# Patient Record
Sex: Female | Born: 1995 | Race: Black or African American | Hispanic: No | Marital: Single | Smoking: Never smoker
Health system: Southern US, Community
[De-identification: ages and names within clinical notes are randomized; demographics above are authoritative.]

## PROBLEM LIST (undated history)

## (undated) HISTORY — PX: TONSILLECTOMY: SUR1361

## (undated) HISTORY — PX: ADENOIDECTOMY: SUR15

---
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
## 2017-01-05 ENCOUNTER — Other Ambulatory Visit (INDEPENDENT_AMBULATORY_CARE_PROVIDER_SITE_OTHER): Payer: Self-pay | Admitting: Radiology

## 2017-01-05 DIAGNOSIS — E559 Vitamin D deficiency, unspecified: Secondary | ICD-10-CM

## 2017-01-05 DIAGNOSIS — M8438XD Stress fracture, other site, subsequent encounter for fracture with routine healing: Secondary | ICD-10-CM

## 2017-02-14 ENCOUNTER — Ambulatory Visit (INDEPENDENT_AMBULATORY_CARE_PROVIDER_SITE_OTHER): Payer: Self-pay | Admitting: Orthopedic Surgery

## 2017-02-14 DIAGNOSIS — G8929 Other chronic pain: Secondary | ICD-10-CM

## 2017-02-14 DIAGNOSIS — M25561 Pain in right knee: Secondary | ICD-10-CM

## 2017-02-15 ENCOUNTER — Ambulatory Visit (INDEPENDENT_AMBULATORY_CARE_PROVIDER_SITE_OTHER): Payer: PRIVATE HEALTH INSURANCE

## 2017-02-15 DIAGNOSIS — Z0189 Encounter for other specified special examinations: Secondary | ICD-10-CM

## 2017-02-16 LAB — FERRITIN: FERRITIN: 9 ng/mL (ref 6–67)

## 2017-02-16 LAB — VITAMIN D 25 HYDROXY (VIT D DEFICIENCY, FRACTURES): Vit D, 25-Hydroxy: 39 ng/mL (ref 30–100)

## 2017-02-17 NOTE — Progress Notes (Signed)
   Post-Op Visit Note   Patient: Tracy Wiley           Date of Birth: 06/17/1996           MRN: [id number] Visit Date: 02/14/2017 PCP: Ronnald NianLalonde, John C, MD   Assessment & Plan:  Chief Complaint: No chief complaint on file.  Visit Diagnoses: No diagnosis found.  Plan: Patient is a you.  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 She is been doing well.  At the time of this dictation vitamin D3 level XXXIX which is improved.  The ferritin level is 9 which is low normal.  Radiographs normal of the knee patella.  Plan is to resume strengthening.  Continue with vitamin D supplementation.  I follow-up as needed. Follow-Up Instructions: Return if symptoms worsen or fail to improve.   Orders:  No orders of the defined types were placed in this encounter.  No orders of the defined types were placed in this encounter.   Imaging: No results found.  PMFS History: There are no active problems to display for this patient.  No past medical history on file.  No family history on file.  Past Surgical History:  Procedure Laterality Date  . ADENOIDECTOMY    . KNEE ARTHROSCOPY Left 2014  . TONSILLECTOMY     Social History   Occupational History  . Not on file.   Social History Main Topics  . Smoking status: Never Smoker  . Smokeless tobacco: Never Used  . Alcohol use No  . Drug use: No  . Sexual activity: Not on file

## 2017-02-22 ENCOUNTER — Telehealth (INDEPENDENT_AMBULATORY_CARE_PROVIDER_SITE_OTHER): Payer: Self-pay | Admitting: Orthopedic Surgery

## 2017-02-22 NOTE — Telephone Encounter (Signed)
02/14/2017 ov note emailed to Molly/trainer @ [email or web address] training room

## 2017-03-21 ENCOUNTER — Ambulatory Visit (INDEPENDENT_AMBULATORY_CARE_PROVIDER_SITE_OTHER): Payer: Self-pay | Admitting: Orthopedic Surgery

## 2017-03-21 DIAGNOSIS — M25561 Pain in right knee: Secondary | ICD-10-CM

## 2017-03-21 DIAGNOSIS — G8929 Other chronic pain: Secondary | ICD-10-CM

## 2017-03-31 ENCOUNTER — Encounter (HOSPITAL_COMMUNITY): Payer: Self-pay | Admitting: Emergency Medicine

## 2017-03-31 ENCOUNTER — Ambulatory Visit (HOSPITAL_COMMUNITY)
Admission: EM | Admit: 2017-03-31 | Discharge: 2017-03-31 | Disposition: A | Discharge status: Home / Self Care | Payer: PRIVATE HEALTH INSURANCE | Attending: Family Medicine | Admitting: Family Medicine

## 2017-03-31 DIAGNOSIS — J00 Acute nasopharyngitis [common cold]: Secondary | ICD-10-CM

## 2017-03-31 MED ORDER — BENZONATATE 100 MG PO CAPS: 100.0000 mg | ORAL_CAPSULE | Freq: Three times a day (TID) | ORAL | 0 refills | Status: DC

## 2017-03-31 MED ORDER — CETIRIZINE-PSEUDOEPHEDRINE ER 5-120 MG PO TB12: 1.0000 | ORAL_TABLET | Freq: Every day | ORAL | 0 refills | Status: AC

## 2017-03-31 MED ORDER — IPRATROPIUM BROMIDE 0.06 % NA SOLN: 2.0000 | Freq: Four times a day (QID) | NASAL | 0 refills | Status: DC

## 2017-03-31 NOTE — Discharge Instructions (Signed)
Tessalon for cough. Start flonase, atrovent nasal sprat, zyrtec-D for nasal congestion. You can use over the counter nasal saline rinse such as neti pot for nasal congestion. Keep hydrated, your urine should be clear to pale yellow in color. Tylenol/motrin for fever and pain. Monitor for any worsening of symptoms, chest pain, shortness of breath, wheezing, swelling of the throat, follow up for reevaluation.

## 2017-03-31 NOTE — ED Provider Notes (Signed)
[facility]    CSN: [id number] Arrival date & time: 03/31/17  1533     History   Chief Complaint Chief Complaint  Patient presents with  . URI    HPI Tracy Wiley is a 21 y.o. female.   21 year old female comes in for 6 day history of productive cough. She has also had some sneezing and body aches. Sore throat when having excessive coughing. Has also experienced nasal congestion, rhinorrhea. Denies ear pain, eye pain. Denies fever, chills, night sweats. Has some chest pain with coughing, denies shortness of breath, wheezing. Tried otc theraflu, cold medication without improvement. Symptoms has been the same throughout, denies worsening of symptoms. Denies sick contact. History of seasonal allergies, has not had any antihistamines recently. Never smoker.        History reviewed. No pertinent past medical history.  There are no active problems to display for this patient.   Past Surgical History:  Procedure Laterality Date  . ADENOIDECTOMY    . KNEE ARTHROSCOPY Left 2014  . TONSILLECTOMY      OB History    No data available       Home Medications    Prior to Admission medications   Medication Sig Start Date End Date Taking? Authorizing Provider  ferrous sulfate 325 (65 FE) MG EC tablet Take 325 mg by mouth 3 (three) times daily with meals.   Yes [provider]  benzonatate (TESSALON) 100 MG capsule Take 1 capsule (100 mg total) by mouth every 8 (eight) hours. 03/31/17   Hallee Mckenny V, PA-C  cetirizine-pseudoephedrine (ZYRTEC-D) 5-120 MG tablet Take 1 tablet by mouth daily. 03/31/17   Melva Faux V, PA-C  ipratropium (ATROVENT) 0.06 % nasal spray Place 2 sprays into both nostrils 4 (four) times daily. 03/31/17   Belinda Fisher, PA-C    Family History No family history on file.  Social History Social History  Substance Use Topics  . Smoking status: Never Smoker  . Smokeless tobacco: Never Used  . Alcohol use No     Allergies   Patient has no  known allergies.   Review of Systems Review of Systems  Reason unable to perform ROS: See HPI as above.     Physical Exam Triage Vital Signs ED Triage Vitals  Enc Vitals Group     BP 03/31/17 1602 120/71     Pulse Rate 03/31/17 1602 (!) 51     Resp 03/31/17 1602 16     Temp 03/31/17 1602 99 F (37.2 C)     Temp Source 03/31/17 1602 Oral     SpO2 03/31/17 1602 100 %     Weight 03/31/17 1600 185 lb (83.9 kg)     Height 03/31/17 1600 6' (1.829 m)     Head Circumference --      Peak Flow --      Pain Score 03/31/17 1600 0     Pain Loc --      Pain Edu? --      Excl. in GC? --    No data found.   Updated Vital Signs BP 120/71   Pulse (!) 51   Temp 99 F (37.2 C) (Oral)   Resp 16   Ht 6' (1.829 m)   Wt 185 lb (83.9 kg)   LMP 03/22/2017   SpO2 100%   BMI 25.09 kg/m   Physical Exam  Constitutional: She is oriented to person, place, and time. She appears well-developed and well-nourished. No distress.  HENT:  Head: Normocephalic and atraumatic.  Right Ear: External ear and ear canal normal. Tympanic membrane is erythematous. Tympanic membrane is not bulging.  Left Ear: External ear and ear canal normal. Tympanic membrane is erythematous. Tympanic membrane is not bulging.  Nose: Mucosal edema and rhinorrhea present. Right sinus exhibits no maxillary sinus tenderness and no frontal sinus tenderness. Left sinus exhibits no maxillary sinus tenderness and no frontal sinus tenderness.  Mouth/Throat: Uvula is midline, oropharynx is clear and moist and mucous membranes are normal.  Eyes: Pupils are equal, round, and reactive to light. Conjunctivae are normal.  Neck: Normal range of motion. Neck supple.  Cardiovascular: Normal rate, regular rhythm and normal heart sounds.  Exam reveals no gallop and no friction rub.   No murmur heard. Pulmonary/Chest: Effort normal and breath sounds normal. She has no decreased breath sounds. She has no wheezes. She has no rhonchi. She has no  rales.  Lymphadenopathy:    She has no cervical adenopathy.  Neurological: She is alert and oriented to person, place, and time.  Skin: Skin is warm and dry.  Psychiatric: She has a normal mood and affect. Her behavior is normal. Judgment normal.     UC Treatments / Results  Labs (all labs ordered are listed, but only abnormal results are displayed) Labs Reviewed - No data to display  EKG  EKG Interpretation None       Radiology No results found.  Procedures Procedures (including critical care time)  Medications Ordered in UC Medications - No data to display   Initial Impression / Assessment and Plan / UC Course  I have reviewed the triage vital signs and the nursing notes.  Pertinent labs & imaging results that were available during my care of the patient were reviewed by me and considered in my medical decision making (see chart for details).    Discussed with patient history and exam most consistent with viral URI. Symptomatic treatment as needed. Push fluids. Return precautions given.    Final Clinical Impressions(s) / UC Diagnoses   Final diagnoses:  Acute nasopharyngitis    New Prescriptions Discharge Medication List as of 03/31/2017  4:21 PM    START taking these medications   Details  benzonatate (TESSALON) 100 MG capsule Take 1 capsule (100 mg total) by mouth every 8 (eight) hours., Starting Thu 03/31/2017, Normal    cetirizine-pseudoephedrine (ZYRTEC-D) 5-120 MG tablet Take 1 tablet by mouth daily., Starting Thu 03/31/2017, Normal    ipratropium (ATROVENT) 0.06 % nasal spray Place 2 sprays into both nostrils 4 (four) times daily., Starting Thu 03/31/2017, Normal          Trinidy Masterson V, PA-C 03/31/17 1624

## 2017-03-31 NOTE — ED Triage Notes (Signed)
PT reports URI symptoms that started Friday. PT reports cough is productive of yellow sputum.

## 2017-04-20 NOTE — Progress Notes (Signed)
   Post-Op Visit Note   Patient: Tracy Wiley           Date of Birth: 09/19/1995           MRN: [id number] Visit Date: 03/21/2017 PCP: System, Provider Not In   Assessment & Plan:  Chief Complaint: No chief complaint on file.  Visit Diagnoses:  1. Chronic pain of right knee     Plan: Patient is a 21 year old with right knee pain.  She states that her right knee buckled 5 days ago.  I saw her last year for stress fracture in the patella.  Her vitamin D level was low normal.  She has been doing some supplement.  On exam she has full active and passive range of motion of the right knee with no real periretinacular tenderness.  No effusion.  Collateral crucial ligaments are stable.  McMurray testing negative for meniscal pathology.  Plan at this time is for observation and conservative treatment and rehabilitation.  From a ligament standpoint her knee is stable.  Not much in the way of tenderness on the patella.  I'll see her back as needed  Follow-Up Instructions: Return if symptoms worsen or fail to improve.   Orders:  No orders of the defined types were placed in this encounter.  No orders of the defined types were placed in this encounter.   Imaging: No results found.  PMFS History: There are no active problems to display for this patient.  No past medical history on file.  No family history on file.  Past Surgical History:  Procedure Laterality Date  . ADENOIDECTOMY    . KNEE ARTHROSCOPY Left 2014  . TONSILLECTOMY     Social History   Occupational History  . Not on file.   Social History Main Topics  . Smoking status: Never Smoker  . Smokeless tobacco: Never Used  . Alcohol use No  . Drug use: No  . Sexual activity: Not on file

## 2017-07-05 ENCOUNTER — Ambulatory Visit (INDEPENDENT_AMBULATORY_CARE_PROVIDER_SITE_OTHER): Payer: Self-pay | Admitting: Orthopedic Surgery

## 2017-07-05 DIAGNOSIS — M25561 Pain in right knee: Secondary | ICD-10-CM

## 2017-07-05 DIAGNOSIS — G8929 Other chronic pain: Secondary | ICD-10-CM

## 2017-07-23 ENCOUNTER — Encounter (INDEPENDENT_AMBULATORY_CARE_PROVIDER_SITE_OTHER): Payer: Self-pay | Admitting: Orthopedic Surgery

## 2017-07-23 NOTE — Progress Notes (Signed)
   Post-Op Visit Note   Patient: Tracy Wiley           Date of Birth: 11/28/1995           MRN: [id number] Visit Date: 07/05/2017 PCP: System, Provider Not In   Assessment & Plan:  Chief Complaint: No chief complaint on file.  Visit Diagnoses:  1. Chronic pain of right knee     Plan: Patient presents for evaluation of right knee pain.  Her last year she had a patellar stress reaction which resolved.  She states she landed wrong on the knee yesterday it is hard for her to extend the knee on the right-hand side.  She describes new limping.  She has been taking ibuprofen.  Her injury now is only 2224 hours old.  She is able to weight-bear on that side.  On exam she has trace effusion.  She does have some patellofemoral grinding but her collateral and cruciate ligaments feel stable.  Extensor mechanism is intact.  No groin pain with internal/external rotation of the leg.  Range of motion is nearly full.  Does have a little bit of pain with full hyperextension on the right.  Impression is impact injury on the knee patella stress reaction.  Trace effusion but collateral and cruciate ligaments feel stable.  I would favor 3 views of the right knee plus rehab for a week and then MRI scanning if she is not any better with that regimen.  I will see her back in 1-2 weeks for clinical recheck if she is not improving.  Follow-Up Instructions: Return if symptoms worsen or fail to improve.   Orders:  No orders of the defined types were placed in this encounter.  No orders of the defined types were placed in this encounter.   Imaging: No results found.  PMFS History: There are no active problems to display for this patient.  History reviewed. No pertinent past medical history.  History reviewed. No pertinent family history.  Past Surgical History:  Procedure Laterality Date  . ADENOIDECTOMY    . KNEE ARTHROSCOPY Left 2014  . TONSILLECTOMY     Social History   Occupational History  . Not  on file  Tobacco Use  . Smoking status: Never Smoker  . Smokeless tobacco: Never Used  Substance and Sexual Activity  . Alcohol use: No  . Drug use: No  . Sexual activity: Not on file

## 2017-09-08 ENCOUNTER — Ambulatory Visit (HOSPITAL_COMMUNITY)
Admission: EM | Admit: 2017-09-08 | Discharge: 2017-09-08 | Disposition: A | Discharge status: Home / Self Care | Payer: PRIVATE HEALTH INSURANCE | Attending: Family Medicine | Admitting: Family Medicine

## 2017-09-08 ENCOUNTER — Encounter (HOSPITAL_COMMUNITY): Payer: Self-pay | Admitting: Family Medicine

## 2017-09-08 DIAGNOSIS — J209 Acute bronchitis, unspecified: Secondary | ICD-10-CM

## 2017-09-08 MED ORDER — PREDNISONE 20 MG PO TABS: 40.0000 mg | ORAL_TABLET | Freq: Every day | ORAL | 0 refills | Status: AC

## 2017-09-08 MED ORDER — IPRATROPIUM BROMIDE 0.06 % NA SOLN: 2.0000 | Freq: Four times a day (QID) | NASAL | 0 refills | Status: AC

## 2017-09-08 NOTE — ED Provider Notes (Addendum)
[facility]    CSN: [id number] Arrival date & time: 09/08/17  1814     History   Chief Complaint Chief Complaint  Patient presents with  . Cough    HPI Tracy Wiley is a 22 y.o. female.   Tracy Wiley presents with complaints of cough and congestion which has been ongoing for weeks. Has not worsened, has improved since originally presented but has since no further improved. No fevers. Denies sore throat, ear pain gi/gu complaints. Delsym has helped with cough. No known ill contacts. Without history of asthma, does not smoke. Has had tonsils removed. Otherwise without contributing medical history.    ROS per HPI.      History reviewed. No pertinent past medical history.  There are no active problems to display for this patient.   Past Surgical History:  Procedure Laterality Date  . ADENOIDECTOMY    . KNEE ARTHROSCOPY Left 2014  . TONSILLECTOMY      OB History   None      Home Medications    Prior to Admission medications   Medication Sig Start Date End Date Taking? Authorizing Provider  cetirizine-pseudoephedrine (ZYRTEC-D) 5-120 MG tablet Take 1 tablet by mouth daily. 03/31/17   Cathie HoopsYu, Amy V, PA-C  ferrous sulfate 325 (65 FE) MG EC tablet Take 325 mg by mouth 3 (three) times daily with meals.    [provider]  ipratropium (ATROVENT) 0.06 % nasal spray Place 2 sprays into both nostrils 4 (four) times daily. 09/08/17   Georgetta HaberBurky, Natalie B, NP  predniSONE (DELTASONE) 20 MG tablet Take 2 tablets (40 mg total) by mouth daily with breakfast for 5 days. 09/08/17 09/13/17  Georgetta HaberBurky, Natalie B, NP    Family History History reviewed. No pertinent family history.  Social History Social History   Tobacco Use  . Smoking status: Never Smoker  . Smokeless tobacco: Never Used  Substance Use Topics  . Alcohol use: No  . Drug use: No     Allergies   Patient has no known allergies.   Review of Systems Review of Systems   Physical Exam Triage Vital  Signs ED Triage Vitals [09/08/17 1855]  Enc Vitals Group     BP 129/70     Pulse Rate 69     Resp 18     Temp 98.6 F (37 C)     Temp src      SpO2 100 %     Weight      Height      Head Circumference      Peak Flow      Pain Score      Pain Loc      Pain Edu?      Excl. in GC?    No data found.  Updated Vital Signs BP 129/70   Pulse 69   Temp 98.6 F (37 C)   Resp 18   LMP 08/25/2017   SpO2 100%   Visual Acuity Right Eye Distance:   Left Eye Distance:   Bilateral Distance:    Right Eye Near:   Left Eye Near:    Bilateral Near:     Physical Exam  Constitutional: She is oriented to person, place, and time. She appears well-developed and well-nourished. No distress.  HENT:  Head: Normocephalic and atraumatic.  Right Ear: Tympanic membrane, external ear and ear canal normal.  Left Ear: Tympanic membrane, external ear and ear canal normal.  Nose: Nose normal.  Mouth/Throat: Uvula is midline, oropharynx is  clear and moist and mucous membranes are normal. Tonsils are 0 on the right. Tonsils are 0 on the left. No tonsillar exudate.  Eyes: Pupils are equal, round, and reactive to light. Conjunctivae and EOM are normal.  Cardiovascular: Normal rate, regular rhythm and normal heart sounds.  Pulmonary/Chest: Effort normal and breath sounds normal.  Without cough throughout exam  Neurological: She is alert and oriented to person, place, and time.  Skin: Skin is warm and dry.     UC Treatments / Results  Labs (all labs ordered are listed, but only abnormal results are displayed) Labs Reviewed - No data to display  EKG  EKG Interpretation None       Radiology No results found.  Procedures Procedures (including critical care time)  Medications Ordered in UC Medications - No data to display   Initial Impression / Assessment and Plan / UC Course  I have reviewed the triage vital signs and the nursing notes.  Pertinent labs & imaging results that were  available during my care of the patient were reviewed by me and considered in my medical decision making (see chart for details).     Without acute findings on exam. Patient expresses that once cough starts it occurs in fits. Prednisone course provided. Encouraged use of nasal spray and allergy medications due to history of allergies as well. Patient verbalized understanding and agreeable to plan.    Final Clinical Impressions(s) / UC Diagnoses   Final diagnoses:  Acute bronchitis, unspecified organism    ED Discharge Orders        Ordered    ipratropium (ATROVENT) 0.06 % nasal spray  4 times daily     09/08/17 1915    predniSONE (DELTASONE) 20 MG tablet  Daily with breakfast     09/08/17 1915       Controlled Substance Prescriptions [place] Controlled Substance Registry consulted? Not Applicable   Georgetta Haber, NP 09/08/17 Serena Croissant    Georgetta Haber, NP 09/08/17 1929

## 2017-09-08 NOTE — Discharge Instructions (Signed)
Push fluids to ensure adequate hydration and keep secretions thin.  Nasal spray for congestion 2-4 times a day. 5 days of prednisone, take with food. If symptoms worsen or do not improve in the next week to return to be seen or to follow up with your PCP.

## 2017-09-08 NOTE — ED Triage Notes (Signed)
Pt here for cough and chest congestion x a couple of weeks.

## 2017-10-11 ENCOUNTER — Other Ambulatory Visit (INDEPENDENT_AMBULATORY_CARE_PROVIDER_SITE_OTHER): Payer: Self-pay

## 2017-10-11 ENCOUNTER — Encounter (INDEPENDENT_AMBULATORY_CARE_PROVIDER_SITE_OTHER): Payer: Self-pay | Admitting: Orthopedic Surgery

## 2017-10-11 ENCOUNTER — Ambulatory Visit (INDEPENDENT_AMBULATORY_CARE_PROVIDER_SITE_OTHER): Payer: PRIVATE HEALTH INSURANCE | Admitting: Orthopedic Surgery

## 2017-10-11 DIAGNOSIS — G8929 Other chronic pain: Secondary | ICD-10-CM | POA: Diagnosis not present

## 2017-10-11 DIAGNOSIS — M25561 Pain in right knee: Secondary | ICD-10-CM

## 2017-10-11 NOTE — Progress Notes (Signed)
   Office Visit Note   Patient: Tracy Wiley           Date of Birth: 03/25/1996           MRN: [id number] Visit Date: 10/11/2017 Requested by: No referring provider defined for this encounter. PCP: System, Provider Not In  Subjective: Chief Complaint  Patient presents with  . Right Knee - Follow-up    HPI: Patient is a patient with right knee pain.  Last year she had a patella stress fracture diagnosed by MRI scan.  Vitamin D was low at that time.  She was supplemented and her knee pain improved but she still has reporting some recurrent pain in that knee.  Particularly after practice.  She describes swelling.  No definite mechanical symptoms but does report anterior pain.  [place]  She takes ibuprofen before practice and games.  She states her vitamin D level did go up to low normal.             ROS: All systems reviewed are negative as they relate to the chief complaint within the history of present illness.  Patient denies  fevers or chills.   Assessment & Plan: Visit Diagnoses:  1. Chronic pain of right knee     Plan: Impression is chronic right knee pain with history of stress fracture of the patella.  It is she still is having some anterior symptoms.  Collateral cruciate ligaments stable today.  Plan repeat MRI scan to make sure the stress reaction has resolved.  I will see her back after that study.  Follow-Up Instructions: Return for after MRI.   Orders:  No orders of the defined types were placed in this encounter.  No orders of the defined types were placed in this encounter.     Procedures: No procedures performed   Clinical Data: No additional findings.  Objective: Vital Signs: There were no vitals taken for this visit.  Physical Exam:   Constitutional: Patient appears well-developed HEENT:  Head: Normocephalic Eyes:EOM are normal Neck: Normal range of motion Cardiovascular: Normal rate Pulmonary/chest: Effort  normal Neurologic: Patient is alert Skin: Skin is warm Psychiatric: Patient has normal mood and affect    Ortho Exam: Orthopedic exam demonstrates full active and passive range of motion of the right knee.  There is no effusion.  She does have some periretinacular tenderness on the right.  Collateral and cruciate ligaments are stable.  No masses lymph adenopathy or skin changes noted in that right knee region.  Specialty Comments:  No specialty comments available.  Imaging: No results found.   PMFS History: There are no active problems to display for this patient.  History reviewed. No pertinent past medical history.  History reviewed. No pertinent family history.  Past Surgical History:  Procedure Laterality Date  . ADENOIDECTOMY    . KNEE ARTHROSCOPY Left 2014  . TONSILLECTOMY     Social History   Occupational History  . Not on file  Tobacco Use  . Smoking status: Never Smoker  . Smokeless tobacco: Never Used  Substance and Sexual Activity  . Alcohol use: No  . Drug use: No  . Sexual activity: Not on file

## 2017-10-20 ENCOUNTER — Ambulatory Visit
Admission: RE | Admit: 2017-10-20 | Discharge: 2017-10-20 | Disposition: A | Discharge status: Home / Self Care | Payer: PRIVATE HEALTH INSURANCE | Source: Ambulatory Visit | Attending: Orthopedic Surgery | Admitting: Orthopedic Surgery

## 2017-10-20 DIAGNOSIS — G8929 Other chronic pain: Secondary | ICD-10-CM

## 2017-10-20 DIAGNOSIS — M25561 Pain in right knee: Principal | ICD-10-CM

## 2017-10-27 ENCOUNTER — Telehealth (INDEPENDENT_AMBULATORY_CARE_PROVIDER_SITE_OTHER): Payer: Self-pay | Admitting: Orthopedic Surgery

## 2017-10-27 NOTE — Telephone Encounter (Signed)
done

## 2017-10-27 NOTE — Telephone Encounter (Signed)
I called can you put Gibson on schedule for tomorrow 815 thx

## 2017-10-27 NOTE — Telephone Encounter (Signed)
Can you please talk with Leotis Shames from [facility]?

## 2017-10-27 NOTE — Telephone Encounter (Signed)
Mayford Knife with [facility] called asked for a call back. She advised patient is going back home before her scheduled appointment on 11/02/17. Lauren asked for a call back to discuss patient. The number to contact Leotis Shames is  [phone]

## 2017-10-28 ENCOUNTER — Encounter (INDEPENDENT_AMBULATORY_CARE_PROVIDER_SITE_OTHER): Payer: Self-pay | Admitting: Orthopedic Surgery

## 2017-10-28 ENCOUNTER — Ambulatory Visit (INDEPENDENT_AMBULATORY_CARE_PROVIDER_SITE_OTHER): Payer: PRIVATE HEALTH INSURANCE | Admitting: Orthopedic Surgery

## 2017-10-28 DIAGNOSIS — M1711 Unilateral primary osteoarthritis, right knee: Secondary | ICD-10-CM

## 2017-10-28 NOTE — Progress Notes (Signed)
Office Visit Note   Patient: Tracy Wiley           Date of Birth: [birth year]           MRN: [id number] Visit Date: 10/28/2017 Requested by: No referring provider defined for this encounter. PCP: System, Provider Not In  Subjective: Chief Complaint  Patient presents with  . Right Knee - Follow-up    HPI: Admission is a patient with right knee pain.  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  Last year around this time she had a stress fracture in that patella which has healed.  Now she is having continued pain in the knee with swelling after games.  Since I have seen her she has had a repeat MRI scan of the right knee.  That scan is reviewed today.  Ligaments and menisci intact.  Joint surfaces are intact except in the patellofemoral joint.  In the patella patient has a medium sized cyst as well as delamination of the chondral cartilage at the patellar apex.  This measures about 7 mm x 11 mm.              ROS: All systems reviewed are negative as they relate to the chief complaint within the history of present illness.  Patient denies  fevers or chills.   Assessment & Plan: Visit Diagnoses:  1. Unilateral primary osteoarthritis, right knee     Plan: Impression is patellofemoral delamination injury affecting the apex of the patella and to my review of the scan some of the trochlea as well.  This does represent a change from last years MRI scan.  Based on her symptoms I think arthroscopy and debridement would be indicated.  She has had arthroscopy on the left knee.  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  She may get this done in [place].  We will try to get a copy of the disc for her so she can make appropriate plans in [place].  Follow-Up Instructions: Return if symptoms worsen or fail to improve.   Orders:  No orders of the defined types were placed in this encounter.  No orders of the  defined types were placed in this encounter.     Procedures: No procedures performed   Clinical Data: No additional findings.  Objective: Vital Signs: There were no vitals taken for this visit.  Physical Exam:   Constitutional: Patient appears well-developed HEENT:  Head: Normocephalic Eyes:EOM are normal Neck: Normal range of motion Cardiovascular: Normal rate Pulmonary/chest: Effort normal Neurologic: Patient is alert Skin: Skin is warm Psychiatric: Patient has normal mood and affect    Ortho Exam: Orthopedic exam demonstrates no effusion in the right knee today.  Range of motion is full.  Patient has about the same amount of patellofemoral crepitus which is mild.  I do not detect any locking or clicking indicating hanging up of that delaminated fragment.  No joint line tenderness today on the right-hand side.  No patellar malalignment or instability or apprehension.  Specialty Comments:  No specialty comments available.  Imaging: No results found.   PMFS History: There are no active problems to display for this patient.  History reviewed. No pertinent past medical history.  History reviewed. No pertinent family history.  Past Surgical History:  Procedure Laterality Date  . ADENOIDECTOMY    . KNEE ARTHROSCOPY Left 2014  . TONSILLECTOMY     Social  History   Occupational History  . Not on file  Tobacco Use  . Smoking status: Never Smoker  . Smokeless tobacco: Never Used  Substance and Sexual Activity  . Alcohol use: No  . Drug use: No  . Sexual activity: Not on file

## 2017-11-02 ENCOUNTER — Ambulatory Visit (INDEPENDENT_AMBULATORY_CARE_PROVIDER_SITE_OTHER): Payer: PRIVATE HEALTH INSURANCE | Admitting: Orthopedic Surgery

## 2018-08-13 IMAGING — MR MR KNEE*R* W/O CM
6 series · 34 of 40 positions shown · non-contrast
Comparison: 09/18/2016

CLINICAL DATA: General right knee pain. Stress fracture last year.
No injections. Pain is worse when exercising.

EXAM:
MRI OF THE RIGHT KNEE WITHOUT CONTRAST
TECHNIQUE: Multiplanar, multisequence MR imaging of the knee was performed. No
intravenous contrast was administered.

[Series 7: PD fat-sat · coronal · right · 3.0mm · 0.33mm/px · 7 of 35 slices shown (1 of 3)]
[im 1/35]
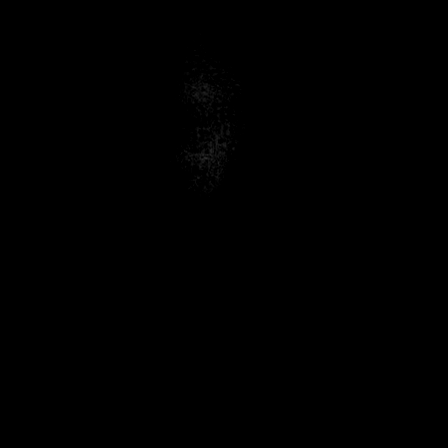
[im 6/35]
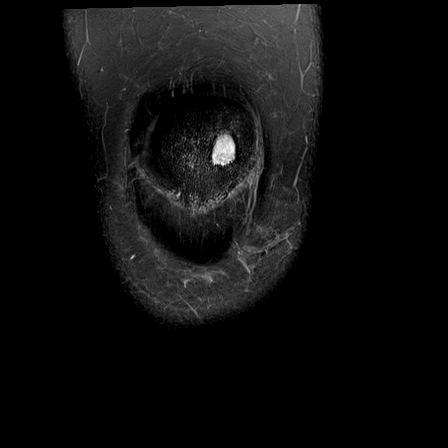
[im 12/35]
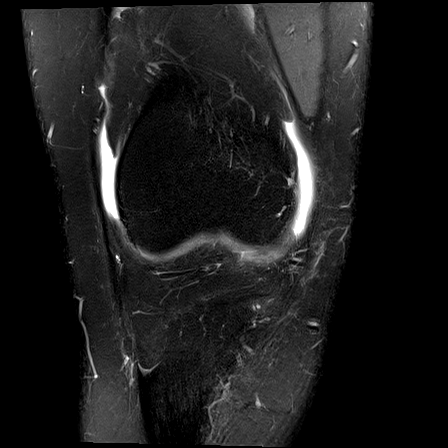
[im 18/35]
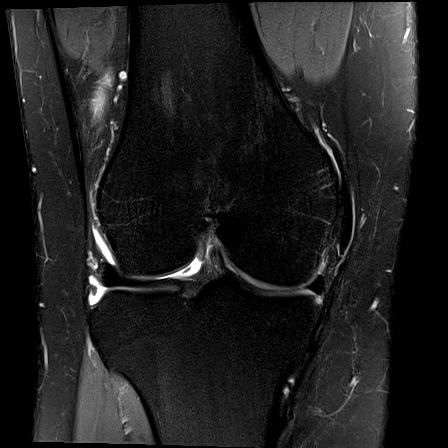
[im 23/35]
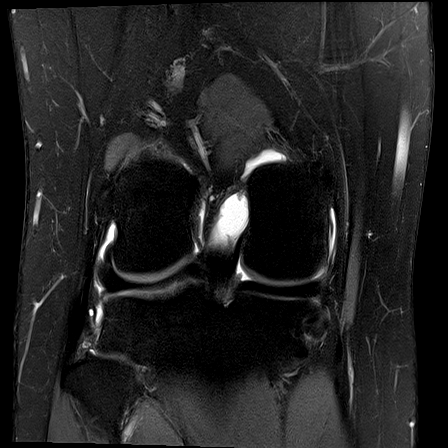
[im 29/35]
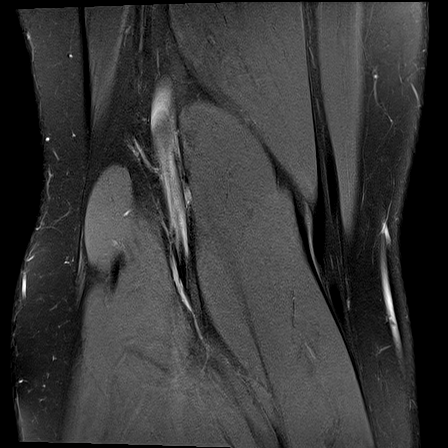
[im 35/35]
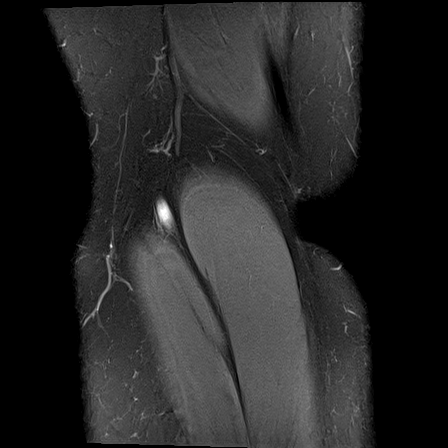

[Series 8: t1_tse_cor · coronal · right · 3.0mm · 0.47mm/px · 1 of 35 slices shown]
[im 1/35]
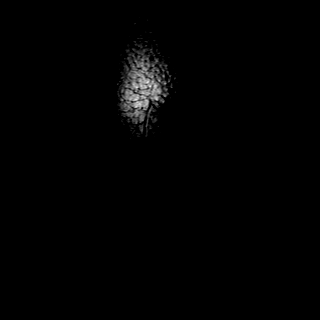

[Series 9: PD fat-sat · sagittal · right · 3.0mm · 0.39mm/px · 6 of 33 slices shown (2 of 3)]
[im 1/33]
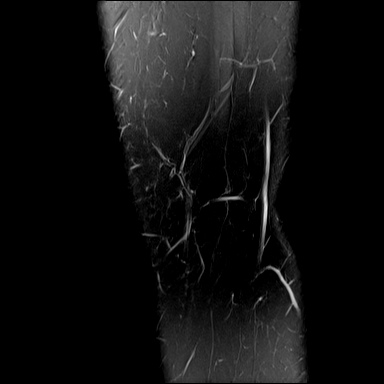
[im 7/33]
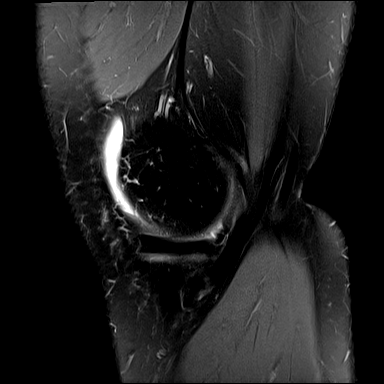
[im 13/33]
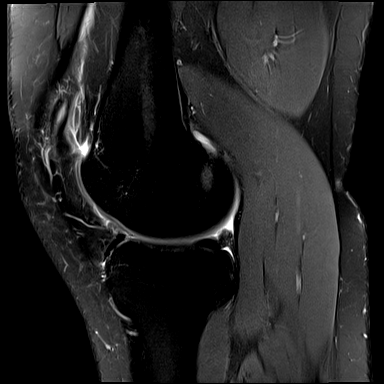
[im 20/33]
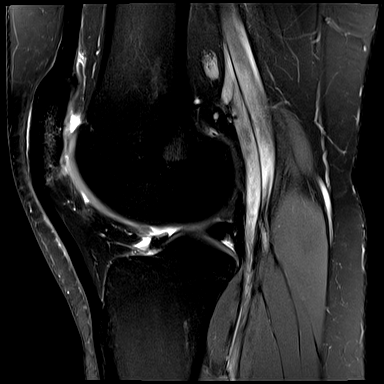
[im 26/33]
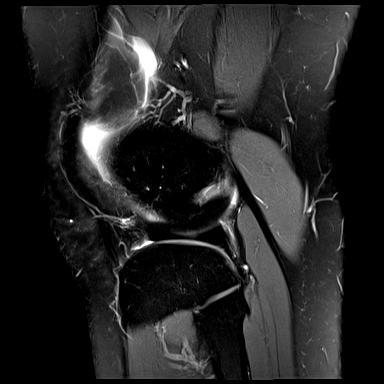
[im 33/33]
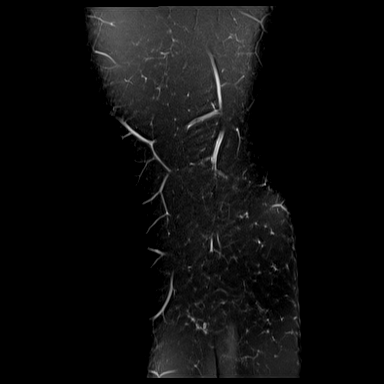

[Series 10: T2 fat-sat · coronal · right · 3.0mm · 0.39mm/px · 7 of 35 slices shown]
[im 1/35]
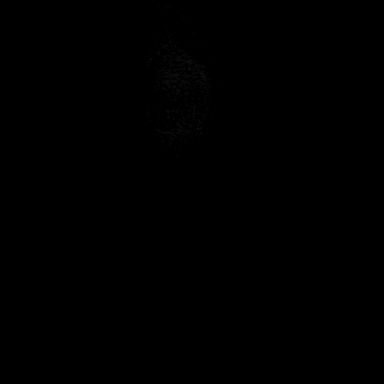
[im 6/35]
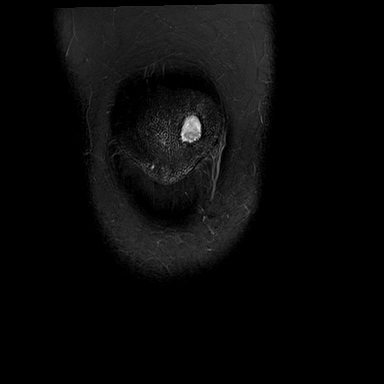
[im 12/35]
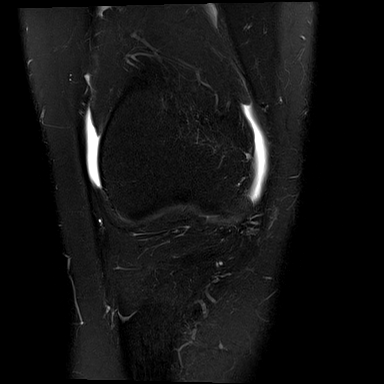
[im 18/35]
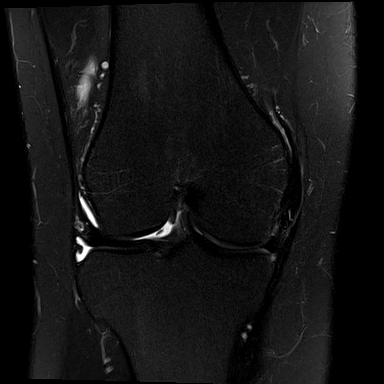
[im 23/35]
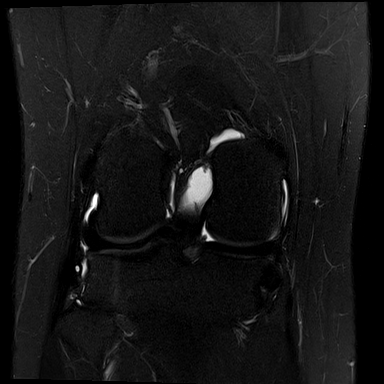
[im 29/35]
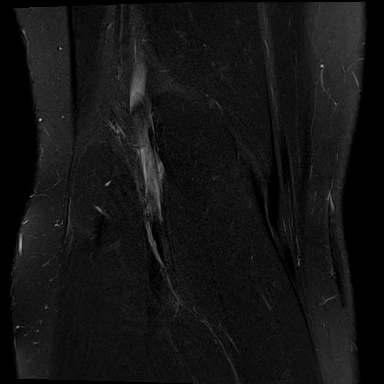
[im 35/35]
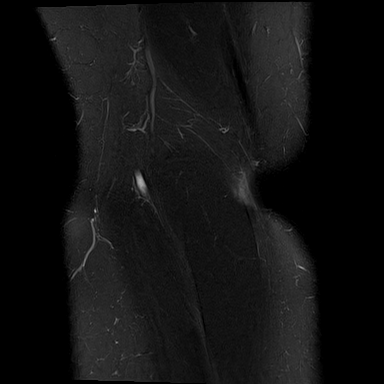

[Series 11: PD · oblique · right · 1.5mm · 0.44mm/px · 5 of 27 slices shown]
[im 1/27]
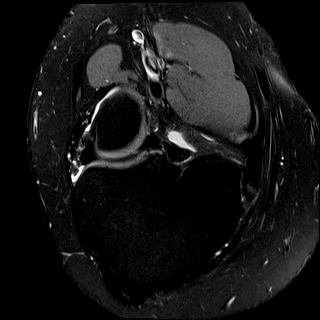
[im 7/27]
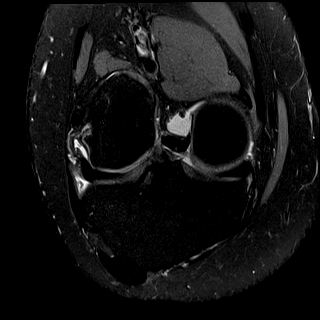
[im 14/27]
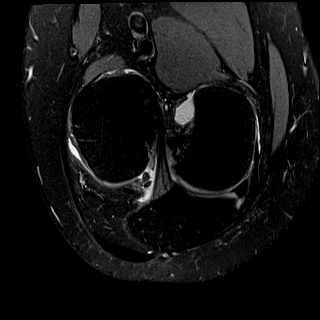
[im 20/27]
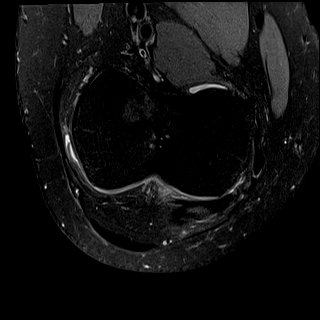
[im 27/27]
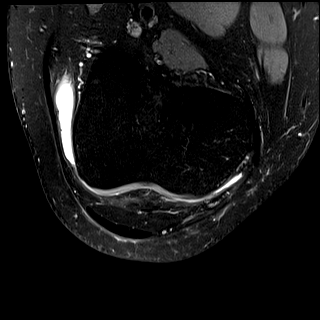

[Series 12: PD fat-sat · axial · right · 3.0mm · 0.39mm/px · z∈[-55,+87]mm · 8 of 44 slices shown (3 of 3)]
[im 1/44]
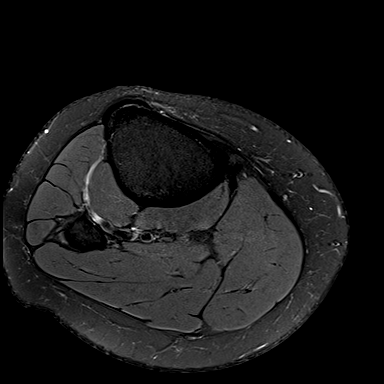
[im 7/44]
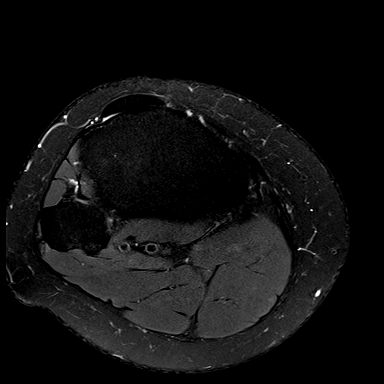
[im 13/44]
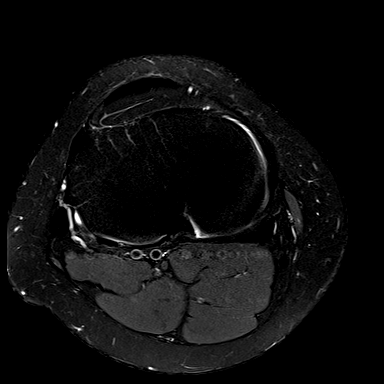
[im 19/44]
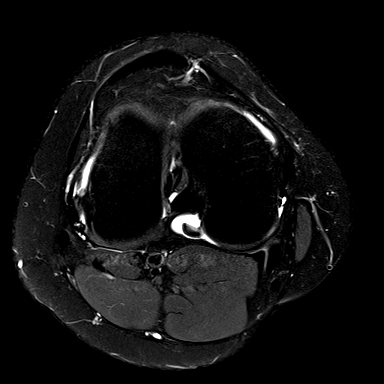
[im 25/44]
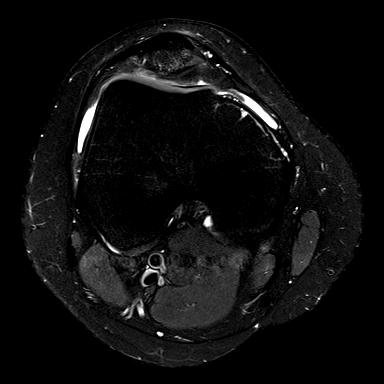
[im 31/44]
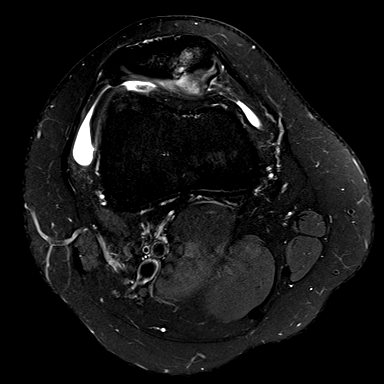
[im 37/44]
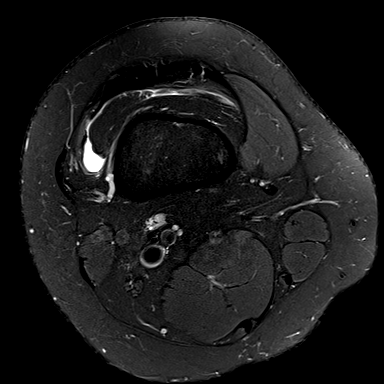
[im 44/44]
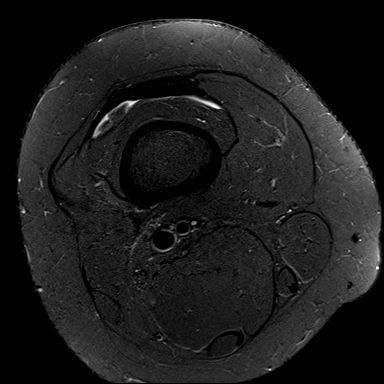

[34 of 40 positions shown; findings below may reference images not displayed]

FINDINGS: MENISCI

Medial meniscus:  Intact.

Lateral meniscus:  Intact.

LIGAMENTS

Cruciates:  Intact ACL and PCL.

Collaterals: Medial collateral ligament is intact. Lateral
collateral ligament complex is intact.

CARTILAGE

Patellofemoral: 7 mm area of delamination involving the patellar
apex measuring approximately 11 mm with subchondral cystic changes
of the patellar apex.

Medial:  No chondral defect.

Lateral:  No chondral defect.

Joint:  No joint effusion. Normal Hoffa's fat. No plical thickening.

Popliteal Fossa:  No Baker cyst. Intact popliteus tendon.

Extensor Mechanism: Intact quadriceps tendon. Intact patellar
tendon. Intact medial patellar retinaculum. Intact lateral patellar
retinaculum. Intact MPFL.

Bones: No other focal marrow signal abnormality. No fracture or
dislocation.

Other: No fluid collection or hematoma.
IMPRESSION: 1. 7 mm area of delamination involving the patellar apex measuring
approximately 11 mm with subchondral cystic changes of the patellar
apex.
